# Patient Record
Sex: Female | Born: 1935 | Race: White | Hispanic: No | Smoking: Never smoker
Health system: Southern US, Community
[De-identification: ages and names within clinical notes are randomized; demographics above are authoritative.]

## PROBLEM LIST (undated history)

## (undated) DIAGNOSIS — E079 Disorder of thyroid, unspecified: Secondary | ICD-10-CM

## (undated) DIAGNOSIS — E039 Hypothyroidism, unspecified: Secondary | ICD-10-CM

## (undated) DIAGNOSIS — E119 Type 2 diabetes mellitus without complications: Secondary | ICD-10-CM

## (undated) DIAGNOSIS — I499 Cardiac arrhythmia, unspecified: Secondary | ICD-10-CM

## (undated) DIAGNOSIS — I10 Essential (primary) hypertension: Secondary | ICD-10-CM

## (undated) DIAGNOSIS — I4891 Unspecified atrial fibrillation: Secondary | ICD-10-CM

---

## 2017-12-07 ENCOUNTER — Encounter (HOSPITAL_COMMUNITY): Payer: Self-pay | Admitting: *Deleted

## 2017-12-07 DIAGNOSIS — I10 Essential (primary) hypertension: Secondary | ICD-10-CM | POA: Diagnosis not present

## 2017-12-07 DIAGNOSIS — K625 Hemorrhage of anus and rectum: Secondary | ICD-10-CM | POA: Diagnosis present

## 2017-12-07 DIAGNOSIS — I2510 Atherosclerotic heart disease of native coronary artery without angina pectoris: Secondary | ICD-10-CM | POA: Diagnosis not present

## 2017-12-07 DIAGNOSIS — E119 Type 2 diabetes mellitus without complications: Secondary | ICD-10-CM | POA: Insufficient documentation

## 2017-12-07 DIAGNOSIS — R197 Diarrhea, unspecified: Secondary | ICD-10-CM | POA: Diagnosis not present

## 2017-12-07 LAB — COMPREHENSIVE METABOLIC PANEL
ALT: 12 U/L — ABNORMAL LOW (ref 14–54)
ANION GAP: 12 (ref 5–15)
AST: 19 U/L (ref 15–41)
Albumin: 3.3 g/dL — ABNORMAL LOW (ref 3.5–5.0)
Alkaline Phosphatase: 71 U/L (ref 38–126)
BILIRUBIN TOTAL: 0.6 mg/dL (ref 0.3–1.2)
BUN: 10 mg/dL (ref 6–20)
CHLORIDE: 96 mmol/L — AB (ref 101–111)
CO2: 22 mmol/L (ref 22–32)
Calcium: 9.2 mg/dL (ref 8.9–10.3)
Creatinine, Ser: 0.68 mg/dL (ref 0.44–1.00)
Glucose, Bld: 132 mg/dL — ABNORMAL HIGH (ref 65–99)
POTASSIUM: 4.1 mmol/L (ref 3.5–5.1)
Sodium: 130 mmol/L — ABNORMAL LOW (ref 135–145)
TOTAL PROTEIN: 6.9 g/dL (ref 6.5–8.1)

## 2017-12-07 LAB — CBC
HEMATOCRIT: 36.2 % (ref 36.0–46.0)
Hemoglobin: 11.9 g/dL — ABNORMAL LOW (ref 12.0–15.0)
MCH: 29.5 pg (ref 26.0–34.0)
MCHC: 32.9 g/dL (ref 30.0–36.0)
MCV: 89.8 fL (ref 78.0–100.0)
Platelets: 372 10*3/uL (ref 150–400)
RBC: 4.03 MIL/uL (ref 3.87–5.11)
RDW: 14.2 % (ref 11.5–15.5)
WBC: 7.5 10*3/uL (ref 4.0–10.5)

## 2017-12-07 LAB — SAMPLE TO BLOOD BANK

## 2017-12-07 LAB — LIPASE, BLOOD: LIPASE: 33 U/L (ref 11–51)

## 2017-12-07 NOTE — ED Triage Notes (Signed)
The pt is c/o dark colored stool tonight after dinner.  She had diarrhea also  No pain no history.  Alert no distress she reports that she is now having a few cramps that just started

## 2017-12-08 ENCOUNTER — Emergency Department (HOSPITAL_COMMUNITY)
Admission: EM | Admit: 2017-12-08 | Discharge: 2017-12-08 | Disposition: A | Discharge status: Home / Self Care | Payer: Medicare Other | Attending: Emergency Medicine | Admitting: Emergency Medicine

## 2017-12-08 DIAGNOSIS — R197 Diarrhea, unspecified: Secondary | ICD-10-CM | POA: Diagnosis not present

## 2017-12-08 HISTORY — DX: Disorder of thyroid, unspecified: E07.9

## 2017-12-08 HISTORY — DX: Unspecified atrial fibrillation: I48.91

## 2017-12-08 HISTORY — DX: Essential (primary) hypertension: I10

## 2017-12-08 HISTORY — DX: Type 2 diabetes mellitus without complications: E11.9

## 2017-12-08 LAB — URINALYSIS, ROUTINE W REFLEX MICROSCOPIC
Bilirubin Urine: NEGATIVE
Glucose, UA: NEGATIVE mg/dL
Ketones, ur: NEGATIVE mg/dL
Nitrite: NEGATIVE
Protein, ur: NEGATIVE mg/dL
Specific Gravity, Urine: 1.02 (ref 1.005–1.030)
pH: 5 (ref 5.0–8.0)

## 2017-12-08 NOTE — ED Provider Notes (Signed)
[facility] Provider Note   CSN: [id number] Arrival date & time: 12/07/17  2157     History   Chief Complaint Chief Complaint  Patient presents with  . Rectal Bleeding    HPI Robin Mcfarland is a 83 y.o. female.  HPI Patient is a 83 year old female who is visiting [place] from [place] to be with her family when she developed some light brown watery discharge after dinner.  She denies nausea vomiting.  No fevers or chills.  She is on chronic anticoagulation but reports no blood.  She believes that the discharge came from her vagina but is unsure.  She states that some of it leaked onto the bathroom floor and she was concerned thus they came to the ER.  She reports some crampy abdominal pain right now.  No nausea or vomiting.  No focal abdominal pain.  No dysuria or urinary frequency.   Past Medical History:  Diagnosis Date  . Atrial fibrillation (HCC)   . Coronary artery disease   . Diabetes mellitus without complication (HCC)   . Hypertension   . Thyroid disease     There are no active problems to display for this patient.   History reviewed. No pertinent surgical history.  OB History    No data available       Home Medications    Prior to Admission medications   Not on File    Family History No family history on file.  Social History Social History   Tobacco Use  . Smoking status: Never Smoker  . Smokeless tobacco: Never Used  Substance Use Topics  . Alcohol use: Yes  . Drug use: Not on file     Allergies   Patient has no known allergies.   Review of Systems Review of Systems  All other systems reviewed and are negative.    Physical Exam Updated Vital Signs BP (!) 146/97   Pulse 92   Temp 98.1 F (36.7 C) (Oral)   Resp 16   Ht 5\' 8"  (1.727 m)   Wt 67.1 kg (148 lb)   SpO2 99%   BMI 22.50 kg/m   Physical Exam  Constitutional: She is oriented to person, place, and time. She appears  well-developed and well-nourished.  HENT:  Head: Normocephalic.  Eyes: EOM are normal.  Neck: Normal range of motion.  Cardiovascular: Normal rate.  Pulmonary/Chest: Effort normal and breath sounds normal.  Abdominal: Soft. She exhibits no distension.  Genitourinary:  Genitourinary Comments: Chaperone present.  Mild discoloration of the left labia as compared to the right of unknown significance.  Cervix is normal.  Vaginal walls are without abnormality.  No vaginal discharge noted.  Rectal examination demonstrates thin light tan colored stool without gross blood.  No obvious abnormalities noted around the rectum.  Musculoskeletal: Normal range of motion.  Neurological: She is alert and oriented to person, place, and time.  Psychiatric: She has a normal mood and affect.  Nursing note and vitals reviewed.    ED Treatments / Results  Labs (all labs ordered are listed, but only abnormal results are displayed) Labs Reviewed  COMPREHENSIVE METABOLIC PANEL - Abnormal; Notable for the following components:      Result Value   Sodium 130 (*)    Chloride 96 (*)    Glucose, Bld 132 (*)    Albumin 3.3 (*)    ALT 12 (*)    All other components within normal limits  CBC - Abnormal; Notable for the  following components:   Hemoglobin 11.9 (*)    All other components within normal limits  URINALYSIS, ROUTINE W REFLEX MICROSCOPIC - Abnormal; Notable for the following components:   APPearance HAZY (*)    Hgb urine dipstick SMALL (*)    Leukocytes, UA TRACE (*)    Bacteria, UA RARE (*)    Squamous Epithelial / LPF 0-5 (*)    All other components within normal limits  LIPASE, BLOOD  SAMPLE TO BLOOD BANK    EKG  EKG Interpretation None       Radiology No results found.  Procedures Procedures (including critical care time)  Medications Ordered in ED Medications - No data to display   Initial Impression / Assessment and Plan / ED Course  I have reviewed the triage vital signs  and the nursing notes.  Pertinent labs & imaging results that were available during my care of the patient were reviewed by me and considered in my medical decision making (see chart for details).     Likely thin tan diarrhea.  Vital signs are stable.  Blood work is stable.  Patient and family informed of the left labia and will need follow-up with a dermatologist and a gynecologist.  This is of unknown significance.  This may represent more changes of chronic irritation  Final Clinical Impressions(s) / ED Diagnoses   Final diagnoses:  Diarrhea, unspecified type    ED Discharge Orders    None       Azalia Bilisampos, Ellerie Arenz, MD 12/08/17 [phone]

## 2017-12-09 ENCOUNTER — Observation Stay (HOSPITAL_COMMUNITY)
Admission: EM | Admit: 2017-12-09 | Discharge: 2017-12-10 | Disposition: A | Discharge status: Home / Self Care | Payer: Medicare Other | Attending: Family Medicine | Admitting: Family Medicine

## 2017-12-09 ENCOUNTER — Other Ambulatory Visit: Payer: Self-pay

## 2017-12-09 ENCOUNTER — Observation Stay (HOSPITAL_COMMUNITY): Payer: Medicare Other

## 2017-12-09 ENCOUNTER — Encounter (HOSPITAL_COMMUNITY): Payer: Self-pay | Admitting: Emergency Medicine

## 2017-12-09 DIAGNOSIS — N939 Abnormal uterine and vaginal bleeding, unspecified: Secondary | ICD-10-CM | POA: Diagnosis not present

## 2017-12-09 DIAGNOSIS — E119 Type 2 diabetes mellitus without complications: Secondary | ICD-10-CM | POA: Insufficient documentation

## 2017-12-09 DIAGNOSIS — Z79899 Other long term (current) drug therapy: Secondary | ICD-10-CM | POA: Insufficient documentation

## 2017-12-09 DIAGNOSIS — E871 Hypo-osmolality and hyponatremia: Secondary | ICD-10-CM | POA: Diagnosis not present

## 2017-12-09 DIAGNOSIS — I482 Chronic atrial fibrillation, unspecified: Secondary | ICD-10-CM | POA: Diagnosis present

## 2017-12-09 DIAGNOSIS — D649 Anemia, unspecified: Secondary | ICD-10-CM | POA: Diagnosis not present

## 2017-12-09 DIAGNOSIS — Z7901 Long term (current) use of anticoagulants: Secondary | ICD-10-CM | POA: Diagnosis not present

## 2017-12-09 DIAGNOSIS — Z7984 Long term (current) use of oral hypoglycemic drugs: Secondary | ICD-10-CM | POA: Diagnosis not present

## 2017-12-09 DIAGNOSIS — E039 Hypothyroidism, unspecified: Secondary | ICD-10-CM | POA: Diagnosis present

## 2017-12-09 DIAGNOSIS — I10 Essential (primary) hypertension: Secondary | ICD-10-CM | POA: Diagnosis not present

## 2017-12-09 HISTORY — DX: Cardiac arrhythmia, unspecified: I49.9

## 2017-12-09 HISTORY — DX: Hypothyroidism, unspecified: E03.9

## 2017-12-09 LAB — COMPREHENSIVE METABOLIC PANEL
ALK PHOS: 68 U/L (ref 38–126)
ALT: 12 U/L — AB (ref 14–54)
AST: 19 U/L (ref 15–41)
Albumin: 3.3 g/dL — ABNORMAL LOW (ref 3.5–5.0)
Anion gap: 10 (ref 5–15)
BILIRUBIN TOTAL: 0.8 mg/dL (ref 0.3–1.2)
BUN: 11 mg/dL (ref 6–20)
CALCIUM: 8.9 mg/dL (ref 8.9–10.3)
CO2: 22 mmol/L (ref 22–32)
CREATININE: 0.76 mg/dL (ref 0.44–1.00)
Chloride: 100 mmol/L — ABNORMAL LOW (ref 101–111)
Glucose, Bld: 110 mg/dL — ABNORMAL HIGH (ref 65–99)
Potassium: 4.3 mmol/L (ref 3.5–5.1)
Sodium: 132 mmol/L — ABNORMAL LOW (ref 135–145)
TOTAL PROTEIN: 6.8 g/dL (ref 6.5–8.1)

## 2017-12-09 LAB — CBC
HCT: 35.9 % — ABNORMAL LOW (ref 36.0–46.0)
Hemoglobin: 11.4 g/dL — ABNORMAL LOW (ref 12.0–15.0)
MCH: 29.2 pg (ref 26.0–34.0)
MCHC: 31.8 g/dL (ref 30.0–36.0)
MCV: 92.1 fL (ref 78.0–100.0)
PLATELETS: 349 10*3/uL (ref 150–400)
RBC: 3.9 MIL/uL (ref 3.87–5.11)
RDW: 14.6 % (ref 11.5–15.5)
WBC: 6.9 10*3/uL (ref 4.0–10.5)

## 2017-12-09 LAB — GLUCOSE, CAPILLARY: Glucose-Capillary: 234 mg/dL — ABNORMAL HIGH (ref 65–99)

## 2017-12-09 LAB — TYPE AND SCREEN
ABO/RH(D): A POS
Antibody Screen: NEGATIVE

## 2017-12-09 LAB — POC OCCULT BLOOD, ED: FECAL OCCULT BLD: POSITIVE — AB

## 2017-12-09 LAB — ABO/RH: ABO/RH(D): A POS

## 2017-12-09 MED ORDER — LISINOPRIL 20 MG PO TABS
40.0000 mg | ORAL_TABLET | Freq: Every day | ORAL | Status: DC
  Administered 2017-12-10: 40 mg via ORAL
  Filled 2017-12-09: qty 2

## 2017-12-09 MED ORDER — MEGESTROL ACETATE 40 MG PO TABS
80.0000 mg | ORAL_TABLET | Freq: Two times a day (BID) | ORAL | Status: DC
  Administered 2017-12-10: 80 mg via ORAL
  Filled 2017-12-09: qty 2

## 2017-12-09 MED ORDER — INSULIN ASPART 100 UNIT/ML ~~LOC~~ SOLN
0.0000 [IU] | Freq: Every day | SUBCUTANEOUS | Status: DC
  Administered 2017-12-09: 3 [IU] via SUBCUTANEOUS

## 2017-12-09 MED ORDER — SIMVASTATIN 5 MG PO TABS
10.0000 mg | ORAL_TABLET | Freq: Every day | ORAL | Status: DC
  Administered 2017-12-09: 10 mg via ORAL
  Filled 2017-12-09: qty 2
  Filled 2017-12-09: qty 1

## 2017-12-09 MED ORDER — MEGESTROL ACETATE 40 MG PO TABS
80.0000 mg | ORAL_TABLET | Freq: Every day | ORAL | Status: DC
  Administered 2017-12-09: 80 mg via ORAL
  Filled 2017-12-09: qty 2

## 2017-12-09 MED ORDER — SODIUM CHLORIDE 0.9 % IV BOLUS (SEPSIS)
1000.0000 mL | Freq: Once | INTRAVENOUS | Status: AC
  Administered 2017-12-09: 1000 mL via INTRAVENOUS

## 2017-12-09 MED ORDER — LEVOTHYROXINE SODIUM 75 MCG PO TABS
75.0000 ug | ORAL_TABLET | Freq: Every day | ORAL | Status: DC
  Administered 2017-12-10: 75 ug via ORAL
  Filled 2017-12-09 (×2): qty 1

## 2017-12-09 MED ORDER — AMLODIPINE BESYLATE 5 MG PO TABS
5.0000 mg | ORAL_TABLET | Freq: Every day | ORAL | Status: DC
  Administered 2017-12-10: 5 mg via ORAL
  Filled 2017-12-09: qty 1

## 2017-12-09 MED ORDER — METOPROLOL TARTRATE 25 MG PO TABS
25.0000 mg | ORAL_TABLET | Freq: Two times a day (BID) | ORAL | Status: DC
  Administered 2017-12-09 – 2017-12-10 (×2): 25 mg via ORAL
  Filled 2017-12-09 (×2): qty 1

## 2017-12-09 NOTE — ED Notes (Signed)
Patient transported to Ultrasound 

## 2017-12-09 NOTE — ED Notes (Signed)
Hemoccult card in room at Pt bedside.

## 2017-12-09 NOTE — ED Triage Notes (Signed)
Pt arrives from home reporting sudden bleeding, states "It was several puddles of blood, and when I wiped it was just gushing out." Pt states unsure if bleeding is from rectum or vagina.  Pt takes Eliquis, [facility], reports diarrhea resolved.  Pt denies pain, dizziness.

## 2017-12-09 NOTE — ED Notes (Signed)
Pt requesting meal, refused to eat [place] sandwich. Regular diet ordered at this time

## 2017-12-09 NOTE — ED Provider Notes (Signed)
Medical screening examination/treatment/procedure(s) were conducted as a shared visit with non-physician practitioner(s) and myself.  I personally evaluated the patient during the encounter.   EKG Interpretation None      83 year old female who presents with vaginal bleeding.  She has a history of atrial fibrillation on Eliquis.  Seen in the emergency department on December 08, 2017 for rectal bleeding versus vaginal bleeding.  She was not having any acute bleeding at that times and had stable blood work and vital signs.  At that time was difficult to discern where she could have been bleeding from.  She returns today is now has been noting seeing large puddles of blood whenever she wiped while using the restroom.  No syncope, near syncope, chest pain or shortness of breath.  No nausea vomiting or abdominal pain.  Patient with evidence of vaginal bleeding on exam today.  Likely related to Eliquis usage.  Still has ongoing bleeding while here in the ED.  Hemodynamically stable.  Hemoglobin close to baseline.  Under discussion with OB/GYN, Megace was recommended.  Given ongoing bleeding on anticoagulation will plan for admission for observation for serial hemoglobin and monitoring.  If bleeding stops, she could follow-up with OB/GYN as an outpatient.   Lavera GuiseLiu, Nadira Single Duo, MD 12/09/17 [phone]

## 2017-12-09 NOTE — H&P (Signed)
History and Physical  Patient Name: Robin Mcfarland Largent     RUE:[id number]    DOB: 09/18/1931    DOA: 12/09/2017 PCP: Fernanda DrumWeidner, Julianne F, MD  Patient coming from: Home  Chief Complaint: Bleeding      HPI: Robin Mcfarland Tuohey is a 83 y.o. female with a past medical history significant for HTN, NIDDM, A. fib on Eliquis who presents with new vaginal bleeding.  The patient was in her usual state of health until this weekend she had an episode where she felt blood trickling down her leg.  She presented to the ER, but they were not able to figure anything out and she was sent home.    Then today she was out and about all day doing errands with her daughter, and shortly after she got home she again had the sensation of blood "gushing" down her leg, went to the bathroom and there was "blood everywhere" so she came to the ER.  She had no urge to defecate, no pain.  She has been on anticoagulation for years.  She has had no weight loss, if any change in abdominal girth, it has been slow >4862yr.   ED course: -Afebrile, heart rate 70s, respirations and pulse ox normal, blood pressure 145/87 -Na 132, K normal, Cr 0.76 (baseline unknown), WBC 6.9 K, Hgb 11.4 and normocytic, no change from 2 days ago -She had a rectal exam that showed brown stool, FOBT+ but possible contaminated by blood on the perineum -On pelvic exam, there was a small amount of bright red blood pooling in the vaginal vault, that would recollect as soon as it could be swabbed away, this wasn't directly vsualized from the cervical os -After pelvic exam, she had no further collecting of blood in her hospital bed -Case was discussed with OB who recommended observation overnight, trend Hgb, start Megace and follow up with OB as outpatient     ROS: Review of Systems  Constitutional: Negative for chills, fever, malaise/fatigue and weight loss.  Gastrointestinal: Negative for abdominal pain, blood in stool, constipation, diarrhea, melena, nausea and  vomiting.  Genitourinary: Negative for dysuria, flank pain, frequency, hematuria and urgency.  All other systems reviewed and are negative.         Past Medical History:  Diagnosis Date  . Atrial fibrillation (HCC)   . Diabetes mellitus without complication (HCC)   . Dysrhythmia   . Hypertension   . Hypothyroidism   . Thyroid disease     History reviewed. No pertinent surgical history.  Social History: Patient lives by herself. She is divorced.  Lives in [place].  The patient walks unassisted.  Never smoker.  Drinks one glass of wine with dinner.  No Known Allergies  Family history: Family were healthy.  Prior to Admission medications   Medication Sig Start Date End Date Taking? Authorizing Provider  amLODipine (NORVASC) 5 MG tablet Take 5 mg by mouth daily. 09/30/17  Yes [provider]  ELIQUIS 5 MG TABS tablet Take 5 mg by mouth 2 (two) times daily. 12/03/17  Yes [provider]  levothyroxine (SYNTHROID, LEVOTHROID) 75 MCG tablet Take 75 mcg by mouth daily. 10/22/17  Yes [provider]  lisinopril (PRINIVIL,ZESTRIL) 40 MG tablet Take 40 mg by mouth daily. 09/30/17  Yes [provider]  metFORMIN (GLUCOPHAGE) 500 MG tablet Take 500 mg by mouth 2 (two) times daily with a meal.   Yes [provider]  metoprolol tartrate (LOPRESSOR) 25 MG tablet Take 25 mg by mouth 2 (two)  times daily. 10/22/17  Yes [provider]  simvastatin (ZOCOR) 10 MG tablet Take 10 mg by mouth at bedtime.  10/22/17  Yes [provider]       Physical Exam: BP (!) 145/66   Pulse 78   Temp (!) 97.3 F (36.3 C) (Oral)   Resp 16   SpO2 100%  General appearance: Well-developed, thin elderly adult female, alert and in no acute distress.   Eyes: Anicteric, conjunctiva pink, lids and lashes normal. PERRL.    ENT: No nasal deformity, discharge, epistaxis.  Hearing normal. OP moist without lesions.   Neck: No neck masses.  Trachea midline.   No thyromegaly/tenderness. Lymph: No cervical or supraclavicular lymphadenopathy.  No inguinal lymph nodes. Skin: Warm and dry.  No jaundice.  No suspicious rashes or lesions. Cardiac: RRR, nl S1-S2, no murmurs appreciated.  Capillary refill is brisk.  JVPnot visible.  No LE edema.  Radial pulses 2+ and symmetric. Respiratory: Normal respiratory rate and rhythm.  CTAB without rales or wheezes. Abdomen: Abdomen soft.  No TTP. No ascites, distension, hepatosplenomegaly. I do not feel a definite pelvic mass on exam.  MSK: No deformities or effusions.  No cyanosis or clubbing. Neuro: Cranial nerves normal.  Sensation intact to light touch. Speech is fluent.  Muscle strength normal.    Psych: Sensorium intact and responding to questions, attention normal.  Behavior appropriate.  Affect normal.  Judgment and insight appear normal.     Labs on Admission:  I have personally reviewed following labs and imaging studies: CBC: Recent Labs  Lab 12/07/17 2211 12/09/17 1541  WBC 7.5 6.9  HGB 11.9* 11.4*  HCT 36.2 35.9*  MCV 89.8 92.1  PLT 372 349   Basic Metabolic Panel: Recent Labs  Lab 12/07/17 2211 12/09/17 1541  NA 130* 132*  K 4.1 4.3  CL 96* 100*  CO2 22 22  GLUCOSE 132* 110*  BUN 10 11  CREATININE 0.68 0.76  CALCIUM 9.2 8.9   GFR: Estimated Creatinine Clearance: 52.8 mL/min (by C-G formula based on SCr of 0.76 mg/dL).  Liver Function Tests: Recent Labs  Lab 12/07/17 2211 12/09/17 1541  AST 19 19  ALT 12* 12*  ALKPHOS 71 68  BILITOT 0.6 0.8  PROT 6.9 6.8  ALBUMIN 3.3* 3.3*   Recent Labs  Lab 12/07/17 2211  LIPASE 33   No results for input(s): AMMONIA in the last 168 hours. Coagulation Profile: No results for input(s): INR, PROTIME in the last 168 hours. Cardiac Enzymes: No results for input(s): CKTOTAL, CKMB, CKMBINDEX, TROPONINI in the last 168 hours. BNP (last 3 results) No results for input(s): PROBNP in the last 8760 hours. HbA1C: No results for  input(s): HGBA1C in the last 72 hours. CBG: No results for input(s): GLUCAP in the last 168 hours. Lipid Profile: No results for input(s): CHOL, HDL, LDLCALC, TRIG, CHOLHDL, LDLDIRECT in the last 72 hours. Thyroid Function Tests: No results for input(s): TSH, T4TOTAL, FREET4, T3FREE, THYROIDAB in the last 72 hours. Anemia Panel: No results for input(s): VITAMINB12, FOLATE, FERRITIN, TIBC, IRON, RETICCTPCT in the last 72 hours. Sepsis Labs:  Invalid input(s): PROCALCITONIN, LACTICIDVEN No results found for this or any previous visit (from the past 240 hour(s)).             Assessment/Plan Principal Problem:   Vaginal bleeding Active Problems:   Essential hypertension   Chronic atrial fibrillation (HCC)   Type 2 diabetes mellitus without complication, without long-term current use of insulin (HCC)   Hyponatremia  Normocytic anemia   Hypothyroidism, acquired  1. Vaginal bleeding:  Differential includes cancer, cancer. AVM considered, colovaginal fistula.  Discussed risks and benefits of continuing Eliquis with patient, I am equivocal, she prefers to hold it. -Megace 80 BID -Hold Eliquis -Pelvic US ordered -If no further bleeding overnight, will discharge with Megace for now, holding Eliquis and outpatient GYN follow up back in [place].     2. A. fib:  CHADS2Vasc 5.   -Hold Eliquis -Continue BB  3. Hypertension:  -Continue BB, ACEi, amlodipine  4.  Diabetes:  -Hold home metformin -SSI  5.  Hypothyroidism:  -Continue levothyroxine       DVT prophylaxis: Not applicable Code Status: DO NOT RESUSCITATE Family Communication: None present Disposition Plan: Anticipate trend hemoglobin overnight, if stable in the morning, no further bleeding, and pelvic ultrasound unrevealing, will discharge with home Megace and plans for follow-up as an outpatient. Consults called: Discussed with Dr. Adrian BlackwaterStinson from OB/GYN by phone  Admission status: Observation At the point of  initial evaluation, it is my clinical opinion that admission for OBSERVATION is reasonable and necessary because the patient's presenting complaints in the context of their chronic conditions represent sufficient risk of deterioration or significant morbidity to constitute reasonable grounds for close observation in the hospital setting, but that the patient may be medically stable for discharge from the hospital within 24 to 48 hours.    Medical decision making: Patient seen at 7:03 PM on 12/09/2017.  The patient was discussed with Will Dancy Dr. Gust RungStenson.  What exists of the patient's chart was reviewed in depth and summarized above.  Clinical condition: Stable.        Alberteen SamChristopher P Mana Haberl [facility] Pager [phone]

## 2017-12-09 NOTE — ED Provider Notes (Signed)
[facility] PROGRESSIVE CARE Provider Note   CSN: [id number] Arrival date & time: 12/09/17  1515     History   Chief Complaint Chief Complaint  Patient presents with  . Vaginal Bleeding    HPI Robin JourneyMiriam Mcfarland is a 83 y.o. female.  Robin Mcfarland is a 83 y.o. Female on Eliquis for A-fib who presents to the ED complaining of bleeding from her vagina.  Patient reports she was walking at home today when she had a gush of blood into her pants and onto the floor.  She reports this happened about 2 days ago and was evaluated in the emergency department, but the bleeding stopped.  They were unsure where the bleeding was coming from.  She denies any history of GI or vaginal bleeding.  No previous abdominal surgeries.  She denies feeling lightheaded or dizzy.  She is unsure exactly where the bleeding is coming from, either from her vagina or from her rectum.  She reports has been compliant with Eliquis for her A. fib.  She denies fevers, chest pain, shortness of breath, lightheadedness, dizziness, syncope, abdominal pain, nausea, vomiting, diarrhea, or rashes.    The history is provided by the patient, medical records and a caregiver. No language interpreter was used.  Vaginal Bleeding  Primary symptoms include vaginal bleeding.  Primary symptoms include no dysuria. Pertinent negatives include no abdominal pain, no diarrhea, no nausea, no vomiting, no light-headedness and no dizziness.    Past Medical History:  Diagnosis Date  . Atrial fibrillation (HCC)   . Diabetes mellitus without complication (HCC)   . Dysrhythmia   . Hypertension   . Hypothyroidism   . Thyroid disease     Patient Active Problem List   Diagnosis Date Noted  . Vaginal bleeding 12/09/2017  . Essential hypertension 12/09/2017  . Chronic atrial fibrillation (HCC) 12/09/2017  . Type 2 diabetes mellitus without complication, without long-term current use of insulin (HCC) 12/09/2017  . Hyponatremia 12/09/2017  .  Normocytic anemia 12/09/2017  . Hypothyroidism, acquired 12/09/2017    History reviewed. No pertinent surgical history.  OB History    No data available       Home Medications    Prior to Admission medications   Medication Sig Start Date End Date Taking? Authorizing Provider  amLODipine (NORVASC) 5 MG tablet Take 5 mg by mouth daily. 09/30/17  Yes [provider]  ELIQUIS 5 MG TABS tablet Take 5 mg by mouth 2 (two) times daily. 12/03/17  Yes [provider]  levothyroxine (SYNTHROID, LEVOTHROID) 75 MCG tablet Take 75 mcg by mouth daily. 10/22/17  Yes [provider]  lisinopril (PRINIVIL,ZESTRIL) 40 MG tablet Take 40 mg by mouth daily. 09/30/17  Yes [provider]  metFORMIN (GLUCOPHAGE) 500 MG tablet Take 500 mg by mouth 2 (two) times daily with a meal.   Yes [provider]  metoprolol tartrate (LOPRESSOR) 25 MG tablet Take 25 mg by mouth 2 (two) times daily. 10/22/17  Yes [provider]  simvastatin (ZOCOR) 10 MG tablet Take 10 mg by mouth at bedtime.  10/22/17  Yes [provider]    Family History Family History  Problem Relation Age of Onset  . Cancer Neg Hx   . Heart disease Neg Hx     Social History Social History   Tobacco Use  . Smoking status: Never Smoker  . Smokeless tobacco: Never Used  Substance Use Topics  . Alcohol use: Yes  . Drug use: Not on file  Allergies   Patient has no known allergies.   Review of Systems Review of Systems  Constitutional: Negative for chills and fever.  HENT: Negative for congestion and sore throat.   Eyes: Negative for visual disturbance.  Respiratory: Negative for cough and shortness of breath.   Cardiovascular: Negative for chest pain.  Gastrointestinal: Negative for abdominal pain, diarrhea, nausea and vomiting.  Genitourinary: Positive for vaginal bleeding. Negative for dysuria.  Musculoskeletal: Negative for back pain and neck pain.  Skin:  Negative for rash.  Neurological: Negative for dizziness, light-headedness and headaches.     Physical Exam Updated Vital Signs BP 121/69 (BP Location: Left Arm)   Pulse 70   Temp 97.7 F (36.5 C) (Oral)   Resp 18   Ht 5\' 8"  (1.727 m)   Wt 64.7 kg (142 lb 10.2 oz)   SpO2 100%   BMI 21.69 kg/m   Physical Exam  Constitutional: She is oriented to person, place, and time. She appears well-developed and well-nourished. No distress.  Nontoxic appearing.  HENT:  Head: Normocephalic and atraumatic.  Mouth/Throat: Oropharynx is clear and moist.  Eyes: Conjunctivae are normal. Pupils are equal, round, and reactive to light. Right eye exhibits no discharge. Left eye exhibits no discharge.  Neck: Neck supple.  Cardiovascular: Normal rate, normal heart sounds and intact distal pulses. Exam reveals no gallop and no friction rub.  No murmur heard. Irregularly irregular rhythm.   A. fib.  Pulmonary/Chest: Effort normal and breath sounds normal. No respiratory distress. She has no wheezes. She has no rales.  Abdominal: Soft. She exhibits no distension. There is no tenderness. There is no guarding.  Abdomen is soft and nontender to palpation.  Genitourinary:  Genitourinary Comments: Pelvic exam with female RN as chaperone.  Blood is briskly filling the vaginal vault.  Cleared blood with large cotton swabs. Cervix is unable to be visualized. No bleeding coming out of vagina when speculum is removed. No adnexal tenderness.   Musculoskeletal: She exhibits no edema.  Lymphadenopathy:    She has no cervical adenopathy.  Neurological: She is alert and oriented to person, place, and time. Coordination normal.  Skin: Skin is warm and dry. Capillary refill takes less than 2 seconds. No rash noted. She is not diaphoretic. No erythema. No pallor.  Psychiatric: She has a normal mood and affect. Her behavior is normal.  Nursing note and vitals reviewed.    ED Treatments / Results  Labs (all labs  ordered are listed, but only abnormal results are displayed) Labs Reviewed  COMPREHENSIVE METABOLIC PANEL - Abnormal; Notable for the following components:      Result Value   Sodium 132 (*)    Chloride 100 (*)    Glucose, Bld 110 (*)    Albumin 3.3 (*)    ALT 12 (*)    All other components within normal limits  CBC - Abnormal; Notable for the following components:   Hemoglobin 11.4 (*)    HCT 35.9 (*)    All other components within normal limits  GLUCOSE, CAPILLARY - Abnormal; Notable for the following components:   Glucose-Capillary 234 (*)    All other components within normal limits  POC OCCULT BLOOD, ED - Abnormal; Notable for the following components:   Fecal Occult Bld POSITIVE (*)    All other components within normal limits  CBC  BASIC METABOLIC PANEL  TYPE AND SCREEN  ABO/RH    EKG  EKG Interpretation None       Radiology US Pelvic  Complete With Transvaginal  Result Date: 12/09/2017 CLINICAL DATA:  Vaginal bleeding. EXAM: TRANSABDOMINAL AND TRANSVAGINAL ULTRASOUND OF PELVIS TECHNIQUE: Both transabdominal and transvaginal ultrasound examinations of the pelvis were performed. Transabdominal technique was performed for global imaging of the pelvis including uterus, ovaries, adnexal regions, and pelvic cul-de-sac. It was necessary to proceed with endovaginal exam following the transabdominal exam to visualize the endometrium. COMPARISON:  None FINDINGS: Uterus Measurements: 11 x 6 x 8 cm. Two areas of heterogeneous, partially calcified intramural mass measuring up to 2.6 cm. Endometrium Abnormal thickening to 2.1 cm with mildly heterogeneous echogenic appearance seen with neighboring fluid/ hemorrhage in the fundic cavity and endocervical canal. No color Doppler flow seen within the echogenic tissue. Right ovary Not visualized, no adnexal mass. Left ovary Not visualized.  No adnexal mass. Other findings No abnormal free fluid. IMPRESSION: 1. Abnormally thickened endometrial  stripe to 2.1 cm, if appropriate for comorbidities endometrial sampling is indicated to exclude carcinoma. If results are benign, sonohysterogram should be considered for focal lesion work-up. (Ref: Radiological Reasoning: Algorithmic Workup of Abnormal Vaginal Bleeding with Endovaginal Sonography and Sonohysterography. AJR 2008; 161:[id number]:S68-73) 2. Fluid/hemorrhage seen in the endometrial cavity and endocervical canal. 3. Small intramural fibroids. 4. Ovaries not visualized.  No adnexal mass. Electronically Signed   By: Marnee SpringJonathon  Watts M.D.   On: 12/09/2017 20:18    Procedures Procedures (including critical care time)  Medications Ordered in ED Medications  megestrol (MEGACE) tablet 80 mg (not administered)  amLODipine (NORVASC) tablet 5 mg (not administered)  levothyroxine (SYNTHROID, LEVOTHROID) tablet 75 mcg (not administered)  lisinopril (PRINIVIL,ZESTRIL) tablet 40 mg (not administered)  metoprolol tartrate (LOPRESSOR) tablet 25 mg (25 mg Oral Given 12/09/17 2256)  simvastatin (ZOCOR) tablet 10 mg (10 mg Oral Given 12/09/17 2256)  insulin aspart (novoLOG) injection 0-9 Units (3 Units Subcutaneous Given 12/09/17 2313)  sodium chloride 0.9 % bolus 1,000 mL (0 mLs Intravenous Stopped 12/09/17 1950)     Initial Impression / Assessment and Plan / ED Course  I have reviewed the triage vital signs and the nursing notes.  Pertinent labs & imaging results that were available during my care of the patient were reviewed by me and considered in my medical decision making (see chart for details).    This is a 83 y.o. Female on Eliquis for A-fib who presents to the ED complaining of bleeding from her vagina.  Patient reports she was walking at home today when she had a gush of blood into her pants and onto the floor.  She reports this happened about 2 days ago and was evaluated in the emergency department, but the bleeding stopped.  They were unsure where the bleeding was coming from.  She denies any  history of GI or vaginal bleeding.  No previous abdominal surgeries.  She denies feeling lightheaded or dizzy.  She is unsure exactly where the bleeding is coming from, either from her vagina or from her rectum.  On exam the patient is afebrile nontoxic-appearing.  Her abdomen is soft and nontender to palpation.  On initial exam rectal exam is performed and patient has blood around her vagina and her rectum.  On digital rectal exam she is appears to have mostly brown stool and this was sent for Hemoccult testing.  We will plan for pelvic exam as this did not seem to be the source of the red blood, but no red blood was seen coming out of her vagina initially on brief exam.  We will set patient up for  pelvic. On pelvic exam patient has blood filling her vaginal vault briskly.  This is the source of the bleeding.  The bleeding stopped with removal of the speculum and no blood was coming out of her vagina when speculum was removed.  We will place pad and recheck.  Short while later patient's pad is not soiled.  CBC shows a hemoglobin of 11.4 which is about a 0.5 g decreased from 2 days ago.  I consulted with OB/GYN Dr. Adrian Blackwater who recommends giving the patient at 80 mg of twice a day.  She reports of her bleeding does not stop by tomorrow he would like hospitalist service to consult OB/GYN service for further evaluation.  He reports likely the Megace will stop her bleeding.  If this controls her bleeding she can follow-up as an outpatient with OB/GYN service.  Plan for admission overnight for observation to ensure stable hemoglobin and stasis of bleeding.  Patient and family agree with plan.  I consulted with Dr. Maryfrances Bunnell who accepted the patient for admission.   This patient was discussed with and evaluated by Dr. Verdie Mosher who agrees with assessment and plan.   Final Clinical Impressions(s) / ED Diagnoses   Final diagnoses:  Abnormal uterine bleeding  Anticoagulated    ED Discharge Orders    None         Everlene Farrier, PA-C 12/10/17 0059    Lavera Guise, MD 12/10/17 1350

## 2017-12-09 NOTE — ED Notes (Signed)
Attempted report to [facility]. Left name and number with [employer] operational officersecretary.

## 2017-12-09 NOTE — ED Notes (Signed)
Pt provided with turkey sandwich and sprite.  

## 2017-12-09 NOTE — ED Notes (Signed)
Admitting at bedside 

## 2017-12-10 DIAGNOSIS — D649 Anemia, unspecified: Secondary | ICD-10-CM | POA: Diagnosis not present

## 2017-12-10 DIAGNOSIS — I10 Essential (primary) hypertension: Secondary | ICD-10-CM | POA: Diagnosis not present

## 2017-12-10 DIAGNOSIS — E871 Hypo-osmolality and hyponatremia: Secondary | ICD-10-CM | POA: Diagnosis not present

## 2017-12-10 DIAGNOSIS — I482 Chronic atrial fibrillation: Secondary | ICD-10-CM | POA: Diagnosis not present

## 2017-12-10 DIAGNOSIS — N939 Abnormal uterine and vaginal bleeding, unspecified: Secondary | ICD-10-CM | POA: Diagnosis not present

## 2017-12-10 DIAGNOSIS — E119 Type 2 diabetes mellitus without complications: Secondary | ICD-10-CM | POA: Diagnosis not present

## 2017-12-10 DIAGNOSIS — E039 Hypothyroidism, unspecified: Secondary | ICD-10-CM | POA: Diagnosis not present

## 2017-12-10 LAB — BASIC METABOLIC PANEL
ANION GAP: 8 (ref 5–15)
BUN: 8 mg/dL (ref 6–20)
CALCIUM: 8.9 mg/dL (ref 8.9–10.3)
CO2: 27 mmol/L (ref 22–32)
Chloride: 99 mmol/L — ABNORMAL LOW (ref 101–111)
Creatinine, Ser: 0.61 mg/dL (ref 0.44–1.00)
Glucose, Bld: 90 mg/dL (ref 65–99)
Potassium: 3.8 mmol/L (ref 3.5–5.1)
Sodium: 134 mmol/L — ABNORMAL LOW (ref 135–145)

## 2017-12-10 LAB — CBC
HEMATOCRIT: 35.3 % — AB (ref 36.0–46.0)
Hemoglobin: 11.2 g/dL — ABNORMAL LOW (ref 12.0–15.0)
MCH: 28.8 pg (ref 26.0–34.0)
MCHC: 31.7 g/dL (ref 30.0–36.0)
MCV: 90.7 fL (ref 78.0–100.0)
PLATELETS: 351 10*3/uL (ref 150–400)
RBC: 3.89 MIL/uL (ref 3.87–5.11)
RDW: 14.3 % (ref 11.5–15.5)
WBC: 5.8 10*3/uL (ref 4.0–10.5)

## 2017-12-10 LAB — GLUCOSE, CAPILLARY
Glucose-Capillary: 174 mg/dL — ABNORMAL HIGH (ref 65–99)
Glucose-Capillary: 98 mg/dL (ref 65–99)

## 2017-12-10 MED ORDER — APIXABAN 5 MG PO TABS
5.0000 mg | ORAL_TABLET | Freq: Two times a day (BID) | ORAL | Status: DC
  Administered 2017-12-10: 5 mg via ORAL
  Filled 2017-12-10: qty 1

## 2017-12-10 MED ORDER — MEGESTROL ACETATE 20 MG PO TABS: 80.0000 mg | ORAL_TABLET | Freq: Two times a day (BID) | ORAL | 2 refills | Status: AC

## 2017-12-10 NOTE — Progress Notes (Signed)
Patient discharged home with family.  IV removed and discharge instructions given. Patient questions asked and answered. Patient will follow up with PCP and consulted gynecologist in [place], [place]. Transported from unit with volunteer services. Lawson RadarHeather M Clemence Lengyel

## 2017-12-10 NOTE — Care Management Note (Signed)
Case Management Note  Patient Details  Name: Robin Mcfarland MRN: [id number] Date of Birth: 10/12/1931  Subjective/Objective:     Pt in with vaginal bleeding. Pt lives in [place] and was up visiting daughter in [place]. Plan is to return to [place] today.               Action/Plan: Pt discharging home with self care. Daughter at bedside and is going to transport pt to [place]. No further needs per CM.  Expected Discharge Date:  12/10/17               Expected Discharge Plan:  Home/Self Care  In-House Referral:     Discharge planning Services     Post Acute Care Choice:    Choice offered to:     DME Arranged:    DME Agency:     HH Arranged:    HH Agency:     Status of Service:  Completed, signed off  If discussed at [employer] Length of Stay Meetings, dates discussed:    Additional Comments:  Kermit BaloKelli F Passion Lavin, RN 12/10/2017, 11:45 AM

## 2017-12-10 NOTE — Progress Notes (Signed)
Patient alert with no c/o pain or discomfort alert and oriented.

## 2017-12-10 NOTE — Discharge Summary (Signed)
Physician Discharge Summary  Robin JourneyMiriam Mcfarland ZOX:[id number] DOB: 03/27/1931 DOA: 12/09/2017  PCP: Fernanda DrumWeidner, Julianne F, MD  Admit date: 12/09/2017 Discharge date: 12/10/2017  Admitted From: Home  Disposition:  Home   Recommendations for Outpatient Follow-up:  1. Follow up with Gynecology as soon as possible  2. Please obtain CBC in one week 3. Please assist patient with GYN referral  Home Health: None  Equipment/Devices: None  Discharge Condition: Good  CODE STATUS: DO NOT RESUSCITATE Diet recommendation: Regular  Brief/Interim Summary: This is a 83 yo F with HTN, DM, cAF on Eliquis who presents with abnormal uterine bleeding.  Two days ago, patient had leaking of blood.  Presented to the ER, source was unclear but bleeding had stopped, Hgb was normal and she was discharged.  Then yesterday, she was walking into her daughter's house and again felt blood leaking down her leg, large amount.  Came to the ER, where rectal exam showed brown stool, but pelvic speculum exam showed pooling of bright red blood in the vault.       AUB Hgb trended overnight, no change. No further bleeding overnight. Pelvic US showed findings concerning for endometrial cancer (see below).  Case was discussed by phone with OB/Gyn who recommended Megace 80 BID, follow up as outpatient with GYN.  Afib I am equivocal about continuing Eliquis, but given her CHADS2Vasc 5, with annualized risk >7%, and the relatively low likelihood of clinically significant bleeding from a carcinoma, I feel cautiously continuing Eliquis for now is reasonable, with hold parameters for family and close GYN follow up.          Discharge Diagnoses:  Principal Problem:   Vaginal bleeding Active Problems:   Essential hypertension   Chronic atrial fibrillation (HCC)   Type 2 diabetes mellitus without complication, without long-term current use of insulin (HCC)   Hyponatremia   Normocytic anemia   Hypothyroidism,  acquired    Discharge Instructions  Discharge Instructions    Diet general   Complete by:  As directed    Discharge instructions   Complete by:  As directed    From Dr. Maryfrances Bunnellanford: Your ultrasound showed abnormal growth in the uterus.  This may be benign or may be more serious, but it requires biopsy, as soon as you are able. Please call Dr. Terie PurserWeidner's office today to arrange for referral to a gynecologist for evaluation.  In the meantime, take megestrol/Megace 80 mg (4 tablets) twice daily until you see the OB/Gyn You may continue your Eliquis for now.  If you have recurrent and sustained heavy bleeding, or you develop dizziness, feeling out of breath, or weakness --> stop the Eliquis until you have seen the OB/Gyn.  Resume your other home medicines.   Increase activity slowly   Complete by:  As directed      Allergies as of 12/10/2017   No Known Allergies     Medication List    TAKE these medications   amLODipine 5 MG tablet Commonly known as:  NORVASC Take 5 mg by mouth daily.   ELIQUIS 5 MG Tabs tablet Generic drug:  apixaban Take 5 mg by mouth 2 (two) times daily.   levothyroxine 75 MCG tablet Commonly known as:  SYNTHROID, LEVOTHROID Take 75 mcg by mouth daily.   lisinopril 40 MG tablet Commonly known as:  PRINIVIL,ZESTRIL Take 40 mg by mouth daily.   megestrol 20 MG tablet Commonly known as:  MEGACE Take 4 tablets (80 mg total) by mouth 2 (two) times daily.  metFORMIN 500 MG tablet Commonly known as:  GLUCOPHAGE Take 500 mg by mouth 2 (two) times daily with a meal.   metoprolol tartrate 25 MG tablet Commonly known as:  LOPRESSOR Take 25 mg by mouth 2 (two) times daily.   simvastatin 10 MG tablet Commonly known as:  ZOCOR Take 10 mg by mouth at bedtime.       No Known Allergies  Consultations:  None   Procedures/Studies: US Pelvic Complete With Transvaginal  Result Date: 12/09/2017 CLINICAL DATA:  Vaginal bleeding. EXAM: TRANSABDOMINAL  AND TRANSVAGINAL ULTRASOUND OF PELVIS TECHNIQUE: Both transabdominal and transvaginal ultrasound examinations of the pelvis were performed. Transabdominal technique was performed for global imaging of the pelvis including uterus, ovaries, adnexal regions, and pelvic cul-de-sac. It was necessary to proceed with endovaginal exam following the transabdominal exam to visualize the endometrium. COMPARISON:  None FINDINGS: Uterus Measurements: 11 x 6 x 8 cm. Two areas of heterogeneous, partially calcified intramural mass measuring up to 2.6 cm. Endometrium Abnormal thickening to 2.1 cm with mildly heterogeneous echogenic appearance seen with neighboring fluid/ hemorrhage in the fundic cavity and endocervical canal. No color Doppler flow seen within the echogenic tissue. Right ovary Not visualized, no adnexal mass. Left ovary Not visualized.  No adnexal mass. Other findings No abnormal free fluid. IMPRESSION: 1. Abnormally thickened endometrial stripe to 2.1 cm, if appropriate for comorbidities endometrial sampling is indicated to exclude carcinoma. If results are benign, sonohysterogram should be considered for focal lesion work-up. (Ref: Radiological Reasoning: Algorithmic Workup of Abnormal Vaginal Bleeding with Endovaginal Sonography and Sonohysterography. AJR 2008; 696:E95-28) 2. Fluid/hemorrhage seen in the endometrial cavity and endocervical canal. 3. Small intramural fibroids. 4. Ovaries not visualized.  No adnexal mass. Electronically Signed   By: Marnee Spring M.D.   On: 12/09/2017 20:18       Subjective: Feels well.  No bleeding overnight.  No dizziness, dyspnea, weakness, orthostasis. No chest pain, confusion.  Discharge Exam: Vitals:   12/10/17 0931 12/10/17 0951  BP: (!) 89/63 116/65  Pulse: 77   Resp: 18   Temp: 97.8 F (36.6 C)   SpO2: 100%    Vitals:   12/10/17 0030 12/10/17 0445 12/10/17 0931 12/10/17 0951  BP: 115/65 100/67 (!) 89/63 116/65  Pulse: 72 60 77   Resp: 18 18 18     Temp: 97.6 F (36.4 C) 97.8 F (36.6 C) 97.8 F (36.6 C)   TempSrc: Oral Oral Oral   SpO2: 100% 98% 100%   Weight:      Height:        General: Pt is alert, awake, not in acute distress Cardiovascular: RRR, S1/S2 +, no rubs, no gallops Respiratory: CTA bilaterally, no wheezing, no rhonchi Abdominal: Soft, NT, ND, bowel sounds + Extremities: no edema, no cyanosis    The results of significant diagnostics from this hospitalization (including imaging, microbiology, ancillary and laboratory) are listed below for reference.     Microbiology: No results found for this or any previous visit (from the past 240 hour(s)).   Labs: BNP (last 3 results) No results for input(s): BNP in the last 8760 hours. Basic Metabolic Panel: Recent Labs  Lab 12/07/17 2211 12/09/17 1541 12/10/17 0410  NA 130* 132* 134*  K 4.1 4.3 3.8  CL 96* 100* 99*  CO2 22 22 27   GLUCOSE 132* 110* 90  BUN 10 11 8   CREATININE 0.68 0.76 0.61  CALCIUM 9.2 8.9 8.9   Liver Function Tests: Recent Labs  Lab 12/07/17 2211 12/09/17 1541  AST 19 19  ALT 12* 12*  ALKPHOS 71 68  BILITOT 0.6 0.8  PROT 6.9 6.8  ALBUMIN 3.3* 3.3*   Recent Labs  Lab 12/07/17 2211  LIPASE 33   No results for input(s): AMMONIA in the last 168 hours. CBC: Recent Labs  Lab 12/07/17 2211 12/09/17 1541 12/10/17 0410  WBC 7.5 6.9 5.8  HGB 11.9* 11.4* 11.2*  HCT 36.2 35.9* 35.3*  MCV 89.8 92.1 90.7  PLT 372 349 351   Cardiac Enzymes: No results for input(s): CKTOTAL, CKMB, CKMBINDEX, TROPONINI in the last 168 hours. BNP: Invalid input(s): POCBNP CBG: Recent Labs  Lab 12/09/17 2230 12/10/17 0624  GLUCAP 234* 174*   D-Dimer No results for input(s): DDIMER in the last 72 hours. Hgb A1c No results for input(s): HGBA1C in the last 72 hours. Lipid Profile No results for input(s): CHOL, HDL, LDLCALC, TRIG, CHOLHDL, LDLDIRECT in the last 72 hours. Thyroid function studies No results for input(s): TSH, T4TOTAL,  T3FREE, THYROIDAB in the last 72 hours.  Invalid input(s): FREET3 Anemia work up No results for input(s): VITAMINB12, FOLATE, FERRITIN, TIBC, IRON, RETICCTPCT in the last 72 hours. Urinalysis    Component Value Date/Time   COLORURINE YELLOW 12/08/2017 0047   APPEARANCEUR HAZY (A) 12/08/2017 0047   LABSPEC 1.020 12/08/2017 0047   PHURINE 5.0 12/08/2017 0047   GLUCOSEU NEGATIVE 12/08/2017 0047   HGBUR SMALL (A) 12/08/2017 0047   BILIRUBINUR NEGATIVE 12/08/2017 0047   KETONESUR NEGATIVE 12/08/2017 0047   PROTEINUR NEGATIVE 12/08/2017 0047   NITRITE NEGATIVE 12/08/2017 0047   LEUKOCYTESUR TRACE (A) 12/08/2017 0047   Sepsis Labs Invalid input(s): PROCALCITONIN,  WBC,  LACTICIDVEN Microbiology No results found for this or any previous visit (from the past 240 hour(s)).   Time coordinating discharge: Over 30 minutes  SIGNED:   Alberteen Samhristopher P Darleny Sem, MD  [facility] 12/10/2017, 9:54 AM   If 7PM-7AM, please contact night-coverage [email or web address] Password [email or web address]

## 2017-12-10 NOTE — Progress Notes (Signed)
Patient not wanting to take insulin because she is used to taking metformin. MD on-call notified. Instructed by MD that the insulin should be used while patient in hospital. This was explained to patient and patient was agreeable.

## 2019-12-08 IMAGING — US US PELVIS COMPLETE TRANSABD/TRANSVAG
1 series · 13 of 25 positions shown · non-contrast
Comparison: None

CLINICAL DATA: Vaginal bleeding.



[Series 1: us pelvis complete transabd/transvag · 0.24mm/px · 13 of 50 slices shown]
[im 1/50]
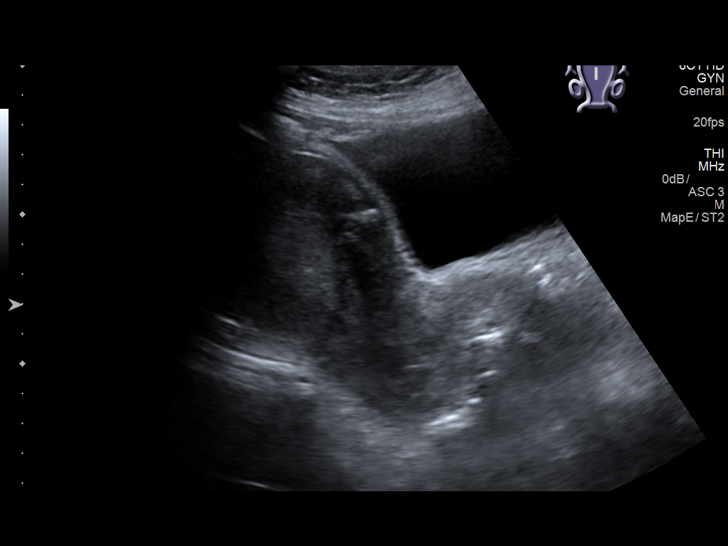
[im 5/50]
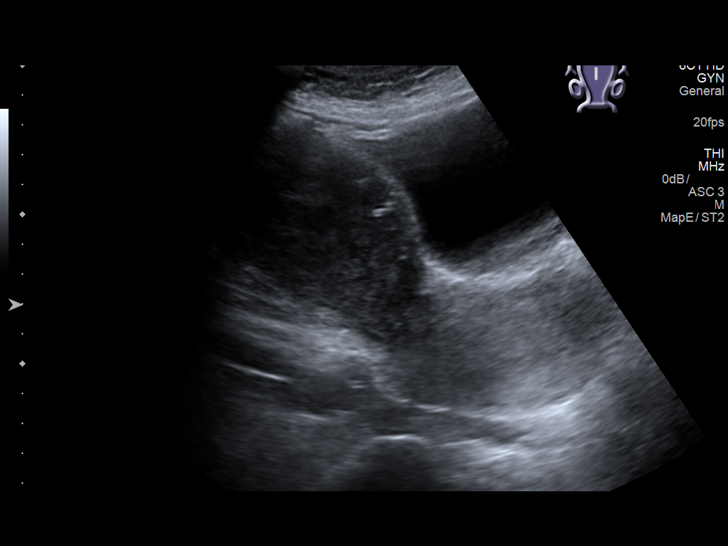
[im 9/50]
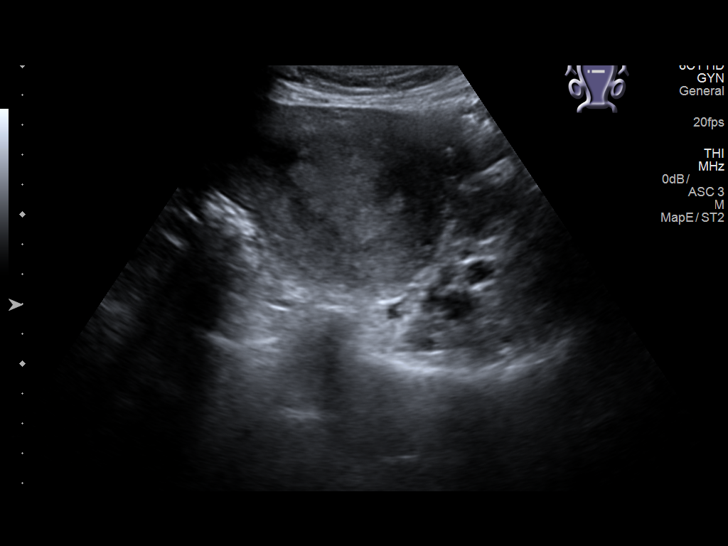
[im 13/50]
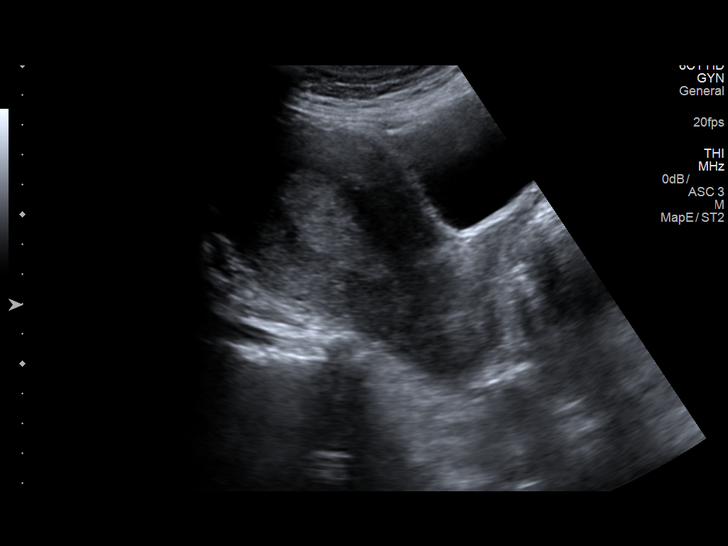
[im 17/50]
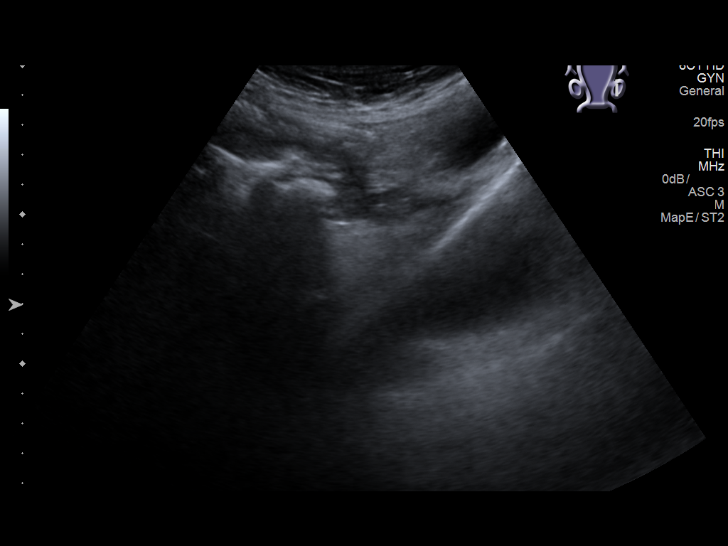
[im 21/50]
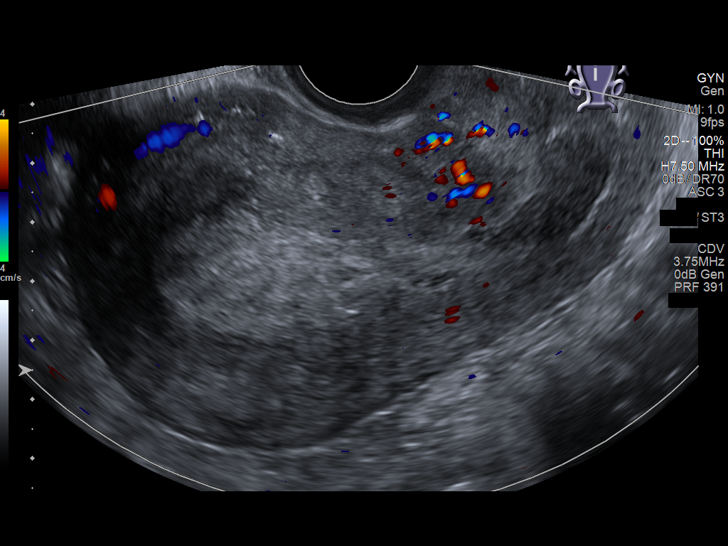
[im 25/50]
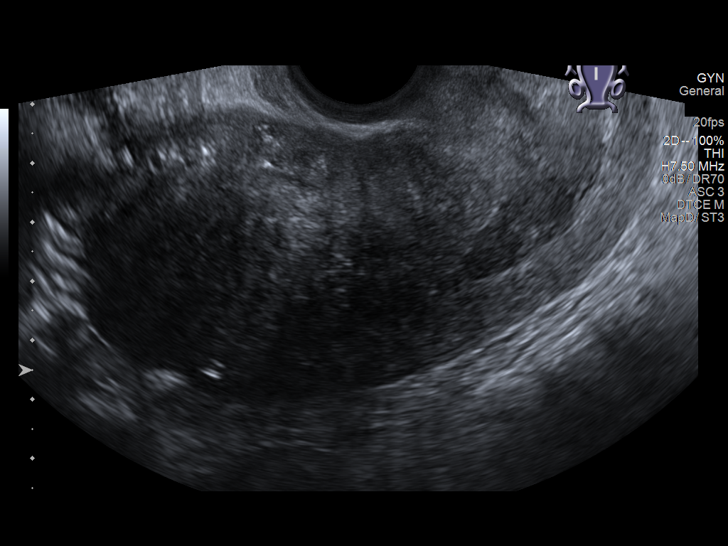
[im 29/50]
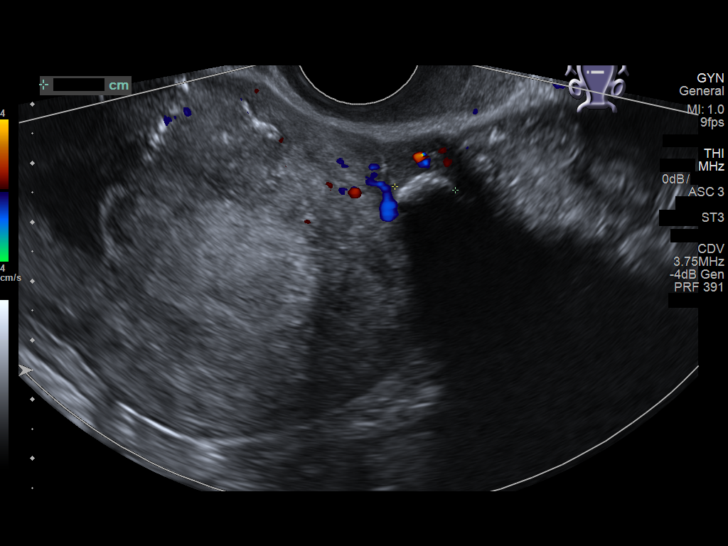
[im 33/50]
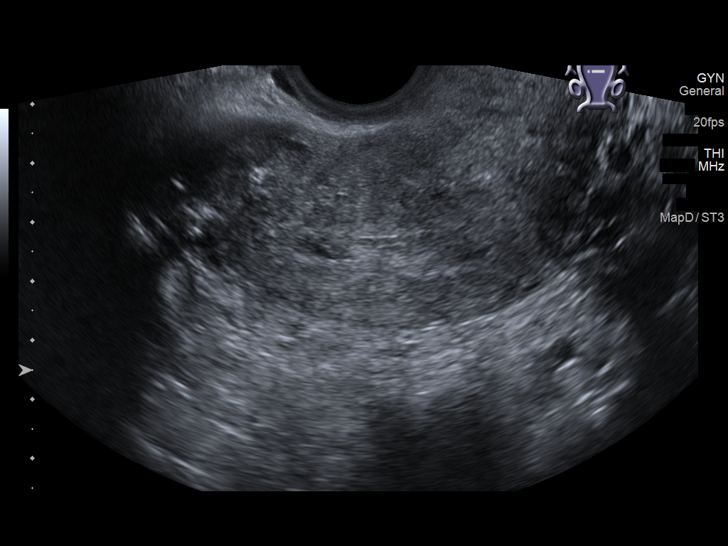
[im 37/50]
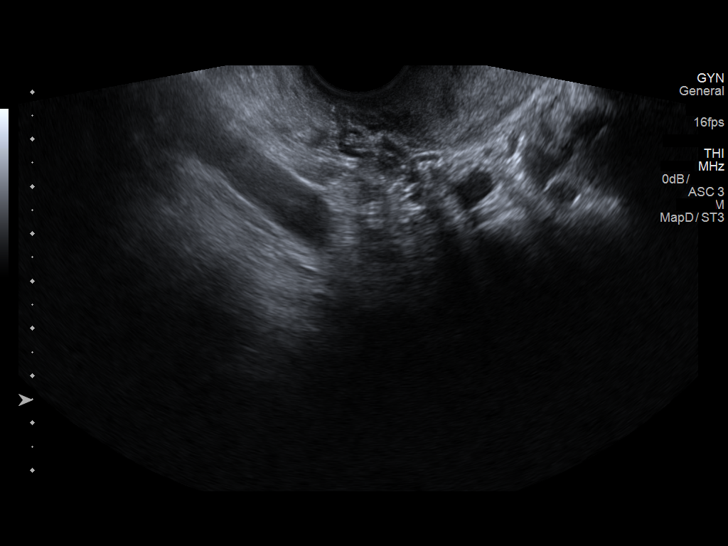
[im 41/50]
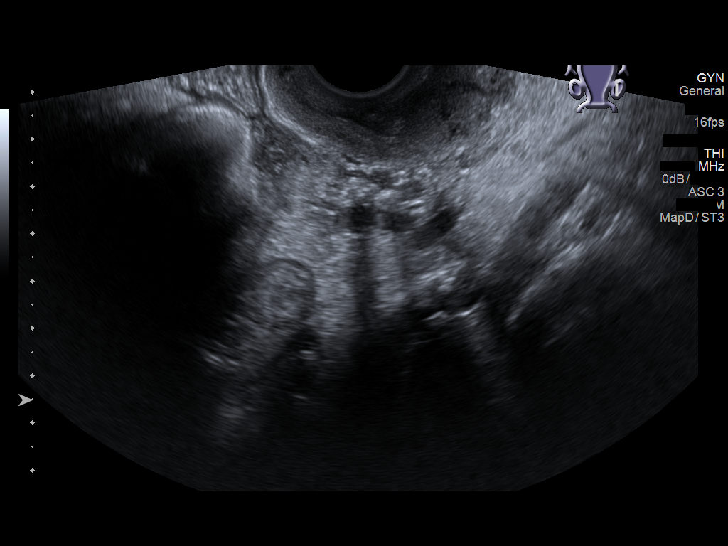
[im 45/50]
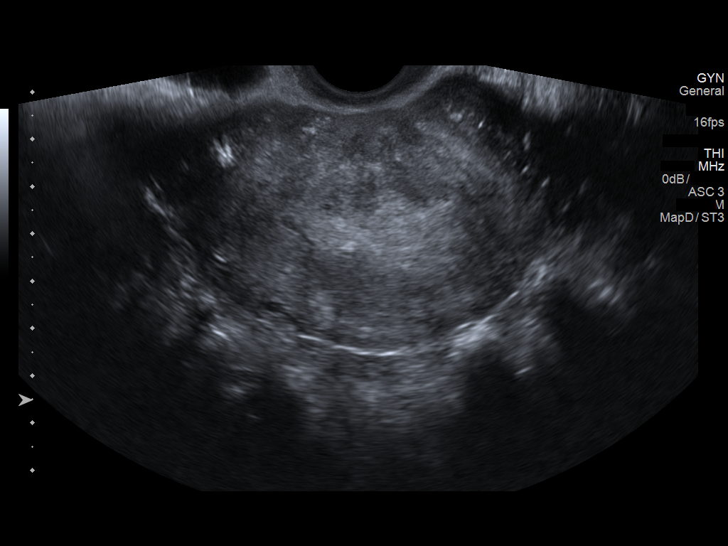
[im 50/50]
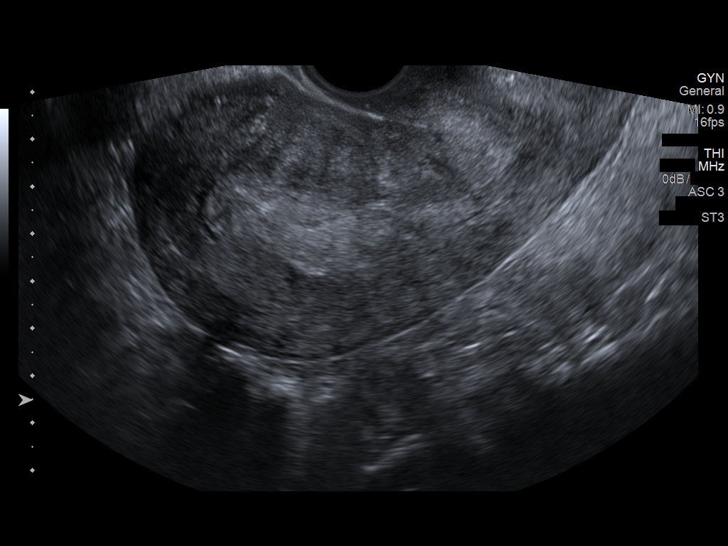

[13 of 25 positions shown; findings below may reference images not displayed]

FINDINGS: Uterus

Measurements: 11 x 6 x 8 cm. Two areas of heterogeneous, partially
calcified intramural mass measuring up to 2.6 cm.

Endometrium

Abnormal thickening to 2.1 cm with mildly heterogeneous echogenic
appearance seen with neighboring fluid/ hemorrhage in the fundic
cavity and endocervical canal. No color Doppler flow seen within the
echogenic tissue.

Right ovary

Not visualized, no adnexal mass.

Left ovary

Not visualized.  No adnexal mass.

Other findings

No abnormal free fluid.
IMPRESSION: 1. Abnormally thickened endometrial stripe to 2.1 cm, if appropriate
for comorbidities endometrial sampling is indicated to exclude
carcinoma. If results are benign, sonohysterogram should be
considered for focal lesion work-up. (Ref: Radiological Reasoning:
Algorithmic Workup of Abnormal Vaginal Bleeding with Endovaginal
Sonography and Sonohysterography. AJR 8220; 191:S68-73)
2. Fluid/hemorrhage seen in the endometrial cavity and endocervical
canal.
3. Small intramural fibroids.
4. Ovaries not visualized.  No adnexal mass.

## 2020-02-21 DEATH — deceased
